# Patient Record
Sex: Female | Born: 2005 | Race: Black or African American | Hispanic: No | Marital: Single | Smoking: Never smoker
Health system: Southern US, Community
[De-identification: ages and names within clinical notes are randomized; demographics above are authoritative.]

## PROBLEM LIST (undated history)

## (undated) DIAGNOSIS — E039 Hypothyroidism, unspecified: Secondary | ICD-10-CM

## (undated) DIAGNOSIS — J45909 Unspecified asthma, uncomplicated: Secondary | ICD-10-CM

## (undated) DIAGNOSIS — J302 Other seasonal allergic rhinitis: Secondary | ICD-10-CM

## (undated) DIAGNOSIS — K760 Fatty (change of) liver, not elsewhere classified: Secondary | ICD-10-CM

## (undated) HISTORY — DX: Other seasonal allergic rhinitis: J30.2

## (undated) HISTORY — PX: WISDOM TOOTH EXTRACTION: SHX21

---
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
## 2024-01-05 NOTE — Procedures (Signed)
 Orders only

## 2024-01-05 NOTE — Procedures (Signed)
 Per appt notes: needs repeat due to insufficient data.

## 2024-01-06 ENCOUNTER — Encounter: Payer: Self-pay | Admitting: Gastroenterology

## 2024-01-06 ENCOUNTER — Encounter
Admission: RE | Disposition: A | Discharge status: Home / Self Care | Payer: Self-pay | Source: Home / Self Care | Attending: Gastroenterology

## 2024-01-06 ENCOUNTER — Ambulatory Visit
Admission: RE | Admit: 2024-01-06 | Discharge: 2024-01-06 | Disposition: A | Discharge status: Home / Self Care | Payer: MEDICAID | Attending: Gastroenterology | Admitting: Gastroenterology

## 2024-01-06 DIAGNOSIS — K581 Irritable bowel syndrome with constipation: Secondary | ICD-10-CM | POA: Diagnosis present

## 2024-01-06 DIAGNOSIS — D508 Other iron deficiency anemias: Secondary | ICD-10-CM | POA: Diagnosis present

## 2024-01-06 DIAGNOSIS — Z539 Procedure and treatment not carried out, unspecified reason: Secondary | ICD-10-CM | POA: Insufficient documentation

## 2024-01-06 HISTORY — PX: COLONOSCOPY: SHX5424

## 2024-01-06 SURGERY — COLONOSCOPY
Anesthesia: General

## 2024-01-06 MED ORDER — SODIUM CHLORIDE 0.9 % IV SOLN: INTRAVENOUS | Status: DC

## 2024-01-06 NOTE — H&P (Signed)
 Ruel Kung , MD [address], [place], [place], [address] Phone: [phone] Fax: [phone]  Primary Care Physician:  [facility]   Pre-Procedure History & Physical: HPI:  Tamara Sanders is a 18 y.o. female is here for an colonoscopy.   Past Medical History:  Diagnosis Date   Asthma    Hypothyroidism    NAFLD (nonalcoholic fatty liver disease)    Seasonal allergies     Past Surgical History:  Procedure Laterality Date   COLONOSCOPY N/A 09/01/2023   Procedure: COLONOSCOPY;  Surgeon: Kung Ruel, MD;  Location: [facility] ENDOSCOPY;  Service: Gastroenterology;  Laterality: N/A;   COLONOSCOPY N/A 09/30/2023   Procedure: COLONOSCOPY;  Surgeon: Kung Ruel, MD;  Location: [facility] ENDOSCOPY;  Service: Gastroenterology;  Laterality: N/A;   ESOPHAGOGASTRODUODENOSCOPY N/A 09/01/2023   Procedure: EGD (ESOPHAGOGASTRODUODENOSCOPY);  Surgeon: Kung Ruel, MD;  Location: [facility] ENDOSCOPY;  Service: Gastroenterology;  Laterality: N/A;   ESOPHAGOGASTRODUODENOSCOPY N/A 09/30/2023   Procedure: EGD (ESOPHAGOGASTRODUODENOSCOPY);  Surgeon: Kung Ruel, MD;  Location: [facility] ENDOSCOPY;  Service: Gastroenterology;  Laterality: N/A;   WISDOM TOOTH EXTRACTION      Prior to Admission medications   Medication Sig Start Date End Date Taking? Authorizing Provider  fexofenadine  (ALLEGRA ) 180 MG tablet Take 1 tablet (180 mg total) by mouth daily. 09/15/22   Orlean Alan HERO, FNP  fluticasone (FLONASE) 50 MCG/ACT nasal spray Place 2 sprays into both nostrils daily. 07/02/22   [provider]  levothyroxine  (SYNTHROID ) 50 MCG tablet Take 1 tablet (50 mcg total) by mouth daily. 05/26/23 05/25/24  Bradler, Evan K, MD  loratadine  (CLARITIN ) 10 MG tablet TAKE 1 TABLET BY MOUTH EVERY DAY 10/09/23   Orlean Alan HERO, FNP  omeprazole  (PRILOSEC) 20 MG capsule Take 1 capsule (20 mg  total) by mouth daily. 08/10/23 02/06/24  [place], PA-C  pantoprazole  (PROTONIX ) 40 MG tablet Take 1 tablet (40 mg total) by mouth daily. 11/21/23 11/20/24  Dorothyann Drivers, MD  polyethylene glycol powder (GLYCOLAX /MIRALAX ) 17 GM/SCOOP powder Take 17 g by mouth daily. 08/10/23 08/04/24  [place], PA-C  sucralfate  (CARAFATE ) 1 g tablet Take 1 tablet (1 g total) by mouth 4 (four) times daily for 15 days. 11/21/23 12/06/23  Dorothyann Drivers, MD  VENTOLIN  HFA 108 (90 Base) MCG/ACT inhaler INHALE 1 TO 2 PUFFS INTO THE LUNGS EVERY 4 HOURS AS NEEDED FOR WHEEZING OR SHORTNESS OF BREATH 11/11/23   Orlean Alan HERO, FNP    Allergies as of 12/09/2023   (No Known Allergies)    No family history on file.  Social History   Socioeconomic History   Marital status: Single    Spouse name: Not on file   Number of children: Not on file   Years of education: Not on file   Highest education level: Not on file  Occupational History   Not on file  Tobacco Use   Smoking status: Never   Smokeless tobacco: Never  Vaping Use   Vaping status: Never Used  Substance and Sexual Activity   Alcohol use: Never   Drug use: Never   Sexual activity: Not on file  Other Topics Concern   Not on file  Social History Narrative   Not on file   Social Drivers of Health   Financial Resource Strain: Low Risk  (12/14/2023)   Received from [facility]   Overall Financial Resource Strain (CARDIA)    How hard is it for you to pay for the very basics like food, housing, medical  care, and heating?: Not hard at all  Food Insecurity: No Food Insecurity (12/14/2023)   Received from [facility]   Hunger Vital Sign    Within the past 12 months, you worried that your food would run out before you got the money to buy more.: Never true    Within the past 12 months, the food you bought just didn't last and you didn't have money to get more.: Never true  Transportation Needs: No Transportation Needs (12/14/2023)    Received from [facility]   PRAPARE - Transportation    Lack of Transportation (Medical): No    Lack of Transportation (Non-Medical): No  Physical Activity: Not on file  Stress: Not on file  Social Connections: Unknown (01/05/2023)   Received from [facility]   Social Network    Social Network: Not on file  Intimate Partner Violence: Not At Risk (08/21/2023)   Received from [facility]   Humiliation, Afraid, Rape, and Kick questionnaire    Within the last year, have you been afraid of your partner or ex-partner?: No    Within the last year, have you been humiliated or emotionally abused in other ways by your partner or ex-partner?: No    Within the last year, have you been kicked, hit, slapped, or otherwise physically hurt by your partner or ex-partner?: No    Within the last year, have you been raped or forced to have any kind of sexual activity by your partner or ex-partner?: No    Review of Systems: See HPI, otherwise negative ROS  Physical Exam: LMP 12/09/2023 (Approximate)  General:   Alert,  pleasant and cooperative in NAD Head:  Normocephalic and atraumatic. Neck:  Supple; no masses or thyromegaly. Lungs:  Clear throughout to auscultation, normal respiratory effort.    Heart:  +S1, +S2, Regular rate and rhythm, No edema. Abdomen:  Soft, nontender and nondistended. Normal bowel sounds, without guarding, and without rebound.   Neurologic:  Alert and  oriented x4;  grossly normal neurologically.  Impression/Plan: Tamara Sanders is here for an colonoscopy to be performed for constipation Risks, benefits, limitations, and alternatives regarding  colonoscopy have been reviewed with the patient.  Questions have been answered.  All parties agreeable.   Ruel Kung, MD  01/06/2024, 9:52 AM

## 2024-01-06 NOTE — Telephone Encounter (Signed)
 Copied from CRM [id number]. Topic: Referral - New Referral Request >> Jan 06, 2024 10:50 AM [name] wrote: The patient is requesting:  Referral: Gastroenterology    Reason for Request: referral for colonoscopy  Facility (Name, Location, & Phone/Fax): Patient/Guardian does not have a preference  Coverage: yes, coverage is accurate on file.  Preferred Contact: Cell Phone Telephone Information: Mobile          [phone]  Routine callback turnaround time: 24-48 business hours. Programmer, systems Notified)

## 2024-01-10 ENCOUNTER — Other Ambulatory Visit: Payer: Self-pay

## 2024-01-10 ENCOUNTER — Encounter: Payer: Self-pay | Admitting: Emergency Medicine

## 2024-01-10 ENCOUNTER — Emergency Department
Admission: EM | Admit: 2024-01-10 | Discharge: 2024-01-11 | Disposition: A | Discharge status: Home / Self Care | Payer: MEDICAID | Attending: Emergency Medicine | Admitting: Emergency Medicine

## 2024-01-10 ENCOUNTER — Emergency Department: Payer: MEDICAID

## 2024-01-10 DIAGNOSIS — K5909 Other constipation: Secondary | ICD-10-CM | POA: Diagnosis not present

## 2024-01-10 DIAGNOSIS — K219 Gastro-esophageal reflux disease without esophagitis: Secondary | ICD-10-CM | POA: Diagnosis not present

## 2024-01-10 DIAGNOSIS — D72829 Elevated white blood cell count, unspecified: Secondary | ICD-10-CM | POA: Diagnosis not present

## 2024-01-10 DIAGNOSIS — R1030 Lower abdominal pain, unspecified: Secondary | ICD-10-CM | POA: Diagnosis present

## 2024-01-10 DIAGNOSIS — R1084 Generalized abdominal pain: Secondary | ICD-10-CM

## 2024-01-10 LAB — COMPREHENSIVE METABOLIC PANEL WITH GFR
ALT: 15 U/L (ref 0–44)
AST: 20 U/L (ref 15–41)
Albumin: 4 g/dL (ref 3.5–5.0)
Alkaline Phosphatase: 68 U/L (ref 38–126)
Anion gap: 9 (ref 5–15)
BUN: 12 mg/dL (ref 6–20)
CO2: 25 mmol/L (ref 22–32)
Calcium: 9.1 mg/dL (ref 8.9–10.3)
Chloride: 105 mmol/L (ref 98–111)
Creatinine, Ser: 0.84 mg/dL (ref 0.44–1.00)
GFR, Estimated: >60 mL/min (ref >60)
Glucose, Bld: 73 mg/dL (ref 70–99)
Potassium: 3.4 mmol/L — ABNORMAL LOW (ref 3.5–5.1)
Sodium: 139 mmol/L (ref 135–145)
Total Bilirubin: 0.9 mg/dL (ref 0.0–1.2)
Total Protein: 7.9 g/dL (ref 6.5–8.1)

## 2024-01-10 LAB — URINALYSIS, ROUTINE W REFLEX MICROSCOPIC
Bacteria, UA: NONE SEEN
Bilirubin Urine: NEGATIVE
Glucose, UA: NEGATIVE mg/dL
Ketones, ur: NEGATIVE mg/dL
Leukocytes,Ua: NEGATIVE
Nitrite: NEGATIVE
Protein, ur: NEGATIVE mg/dL
Specific Gravity, Urine: 1.012 (ref 1.005–1.030)
pH: 6 (ref 5.0–8.0)

## 2024-01-10 LAB — CBC
HCT: 38.2 % (ref 36.0–46.0)
Hemoglobin: 12 g/dL (ref 12.0–15.0)
MCH: 26.7 pg (ref 26.0–34.0)
MCHC: 31.4 g/dL (ref 30.0–36.0)
MCV: 84.9 fL (ref 80.0–100.0)
Platelets: 309 K/uL (ref 150–400)
RBC: 4.5 MIL/uL (ref 3.87–5.11)
RDW: 13.8 % (ref 11.5–15.5)
WBC: 12.3 K/uL — ABNORMAL HIGH (ref 4.0–10.5)
nRBC: 0 % (ref 0.0–0.2)

## 2024-01-10 LAB — LIPASE, BLOOD: Lipase: 39 U/L (ref 11–51)

## 2024-01-10 LAB — POC URINE PREG, ED: Preg Test, Ur: NEGATIVE

## 2024-01-10 MED ORDER — LACTATED RINGERS IV BOLUS
1000.0000 mL | Freq: Once | INTRAVENOUS | Status: AC
  Administered 2024-01-10: 1000 mL via INTRAVENOUS

## 2024-01-10 MED ORDER — IOHEXOL 350 MG/ML SOLN
100.0000 mL | Freq: Once | INTRAVENOUS | Status: AC | PRN
  Administered 2024-01-10: 100 mL via INTRAVENOUS

## 2024-01-10 MED ORDER — ONDANSETRON HCL 4 MG/2ML IJ SOLN
4.0000 mg | INTRAMUSCULAR | Status: AC
  Administered 2024-01-10: 4 mg via INTRAVENOUS
  Filled 2024-01-10: qty 2

## 2024-01-10 MED ORDER — MORPHINE SULFATE (PF) 4 MG/ML IV SOLN
4.0000 mg | Freq: Once | INTRAVENOUS | Status: AC
  Administered 2024-01-10: 4 mg via INTRAVENOUS
  Filled 2024-01-10: qty 1

## 2024-01-10 NOTE — ED Provider Notes (Signed)
 [facility] Provider Note    Event Date/Time   First MD Initiated Contact with Patient 01/10/24 2303     (approximate)   History   Abdominal Pain   HPI Tamara Sanders is a 18 y.o. female who presents for evaluation of pain in her lower abdomen that is gradually gotten worse over the last 2 days.  She said that her menstrual cycle started about a week ago and has been heavier than usual.  However the pain just started about 2 days ago and it is all throughout her abdomen, sometimes going up into her upper abdomen and sometimes remaining in the lower abdomen.  She has a history of constipation for which she was supposed to have a colonoscopy last week but the bowel prep was reportedly insufficient and it was canceled and delayed until this coming week.  She said that she drank all of the liquids and medications that she was supposed to take to prepare for the scope.  She said that it hurts to walk around and to push on her abdomen.  She has had nausea but no vomiting and no diarrhea.     Physical Exam   Triage Vital Signs: ED Triage Vitals  Encounter Vitals Group     BP 01/10/24 2150 117/80     Girls Systolic BP Percentile --      Girls Diastolic BP Percentile --      Boys Systolic BP Percentile --      Boys Diastolic BP Percentile --      Pulse Rate 01/10/24 2150 (!) 121     Resp 01/10/24 2150 18     Temp 01/10/24 2150 (!) 97.4 F (36.3 C)     Temp src --      SpO2 01/10/24 2150 100 %     Weight 01/10/24 2148 113.4 kg (250 lb)     Height 01/10/24 2148 1.6 m (5' 3)     Head Circumference --      Peak Flow --      Pain Score 01/10/24 2148 8     Pain Loc --      Pain Education --      Exclude from Growth Chart --     Most recent vital signs: Vitals:   01/10/24 2150  BP: 117/80  Pulse: (!) 121  Resp: 18  Temp: (!) 97.4 F (36.3 C)  SpO2: 100%    General: Awake, no distress.  CV:  Good peripheral perfusion.  Resp:  Normal effort.  Speaking easily and comfortably, no accessory muscle usage nor intercostal retractions.   Abd:  Obese.  Soft but with tenderness to palpation all throughout the lower abdomen with no specific localized peritonitis but tenderness and guarding throughout.  No tenderness of the epigastrium or right upper quadrant with negative Murphy sign.   ED Results / Procedures / Treatments   Labs (all labs ordered are listed, but only abnormal results are displayed) Labs Reviewed  COMPREHENSIVE METABOLIC PANEL WITH GFR - Abnormal; Notable for the following components:      Result Value   Potassium 3.4 (*)    All other components within normal limits  CBC - Abnormal; Notable for the following components:   WBC 12.3 (*)    All other components within normal limits  URINALYSIS, ROUTINE W REFLEX MICROSCOPIC - Abnormal; Notable for the following components:   Color, Urine STRAW (*)    APPearance CLEAR (*)    Hgb urine dipstick LARGE (*)  All other components within normal limits  LIPASE, BLOOD  POC URINE PREG, ED     RADIOLOGY See ED course for details   PROCEDURES:  Critical Care performed: No  Procedures    IMPRESSION / MDM / ASSESSMENT AND PLAN / ED COURSE  I reviewed the triage vital signs and the nursing notes.                              Differential diagnosis includes, but is not limited to, ovarian cyst plus or minus bleeding or rupture, appendicitis, diverticulitis, biliary colic, less likely STD/PID  Patient's presentation is most consistent with acute presentation with potential threat to life or bodily function.  Labs/studies ordered: CMP, CBC, urinalysis, lipase, urine pregnancy test, CT of the abdomen pelvis  Interventions/Medications given:  Medications  morphine  (PF) 4 MG/ML injection 4 mg (4 mg Intravenous Given 01/10/24 2346)  ondansetron  (ZOFRAN ) injection 4 mg (4 mg Intravenous Given 01/10/24 2345)  lactated ringers  bolus 1,000 mL (1,000 mLs Intravenous New  Bag/Given 01/10/24 2350)  iohexol  (OMNIPAQUE ) 350 MG/ML injection 100 mL (100 mLs Intravenous Contrast Given 01/10/24 2355)  dicyclomine  (BENTYL ) capsule 20 mg (20 mg Oral Given 01/11/24 0121)  pantoprazole  (PROTONIX ) injection 40 mg (40 mg Intravenous Given 01/11/24 0120)  ketorolac  (TORADOL ) 30 MG/ML injection 15 mg (15 mg Intravenous Given 01/11/24 0120)    (Note:  hospital course my include additional interventions and/or labs/studies not listed above.)   Patient is generally well-appearing but she is tachycardic and having generalized abdominal pain.  Lab work is all essentially normal other than a mild nonspecific leukocytosis.  She is quite tender to palpation in the abdomen.  I have ordered morphine  4 mg IV, Zofran  4 mg IV, and 1 L of LR.  CT scan pending, I considered ultrasound but given the generalized tenderness I think the CT would be more beneficial to rule out acute and emergent abnormality such as active extravasation of ovarian cyst.     Clinical Course as of 01/11/24 0154  Mon Jan 11, 2024  0035 CT ABDOMEN PELVIS W CONTRAST I independently viewed and interpreted the patient's abd/pelvis CT, as well as reviewing the radiologist's report.  The patient has no obvious obstruction, excessive stool burden, inflammation in the bowels, or large ovarian cysts.  I do not appreciate any free fluid in the abdomen.  The radiologist confirmed no acute findings. [CF]  0109 Reassessed patient.  She is reporting persistent discomfort in her abdomen and said that it comes and goes in waves and is rolling.  She said that she feels it up in her esophagus.  She takes pantoprazole  once daily.  I explained to her and her mother, who is at bedside, that her workup has been very reassuring with no evidence of an emergent condition at this time.  The CT scan was normal and we went over all the other results.  It sounds as if she is suffering from both pain associate with constipation as well as persistent  acid reflux.  The patient mention that she has had an endoscopy before and they told her that her esophagus looked normal but I still feel like I have acid reflux.  I explained to her that you can have acid reflux without seeing it on the endoscopy and recommended she talk with her gastroenterologist about additional or alternative medication therapy to help with the reflux.  Her heart rate has come down to read around  100.  I will provide additional nonopioid medication because opioids will likely only make her more constipated.  I ordered Toradol  15 mg IV, Bentyl  20 mg p.o., and pantoprazole  40 mg.  I explained to the patient and her mother that given no evidence of an emergent/acute issue, the plan is for discharge and close outpatient follow-up.  They understand the plan.  I had a long talk about how it is unlikely I would be able to turn off the symptoms, the goal is to manage them so that she can follow-up as an outpatient [CF]    Clinical Course User Index [CF] Gordan Huxley, MD     FINAL CLINICAL IMPRESSION(S) / ED DIAGNOSES   Final diagnoses:  Generalized abdominal pain  Gastroesophageal reflux disease, unspecified whether esophagitis present  Chronic constipation     Rx / DC Orders   ED Discharge Orders          Ordered    dicyclomine  (BENTYL ) 10 MG capsule  3 times daily PRN        01/11/24 0112    ondansetron  (ZOFRAN -ODT) 4 MG disintegrating tablet        01/11/24 0112             Note:  This document was prepared using Dragon voice recognition software and may include unintentional dictation errors.   Gordan Huxley, MD 01/11/24 [phone]

## 2024-01-10 NOTE — ED Triage Notes (Addendum)
 Pt c/o lower abdominal pain x 2 days. Denies urinary symptoms. States that her menstrual cycle that started on 8/3 has been heavier with occasional blood clots.

## 2024-01-11 MED ORDER — DICYCLOMINE HCL 10 MG PO CAPS: 10.0000 mg | ORAL_CAPSULE | Freq: Three times a day (TID) | ORAL | 0 refills | Status: AC | PRN

## 2024-01-11 MED ORDER — KETOROLAC TROMETHAMINE 30 MG/ML IJ SOLN
15.0000 mg | Freq: Once | INTRAMUSCULAR | Status: AC
  Administered 2024-01-11 (×2): 15 mg via INTRAVENOUS
  Filled 2024-01-11: qty 1

## 2024-01-11 MED ORDER — ONDANSETRON 4 MG PO TBDP: ORAL_TABLET | ORAL | 0 refills | Status: AC

## 2024-01-11 MED ORDER — PANTOPRAZOLE SODIUM 40 MG IV SOLR
40.0000 mg | Freq: Once | INTRAVENOUS | Status: AC
  Administered 2024-01-11 (×2): 40 mg via INTRAVENOUS
  Filled 2024-01-11: qty 10

## 2024-01-11 MED ORDER — DICYCLOMINE HCL 10 MG PO CAPS
20.0000 mg | ORAL_CAPSULE | Freq: Once | ORAL | Status: AC
  Administered 2024-01-11 (×2): 20 mg via ORAL
  Filled 2024-01-11: qty 2

## 2024-01-11 NOTE — Discharge Instructions (Addendum)
 You have been seen in the Emergency Department (ED) for abdominal pain.  Your evaluation did not identify a clear cause of your symptoms but was generally reassuring.  Please take any medications you have been previously prescribed as well as any new medications prescribed tonight according to the instructions on the label.  Please read through the information about chronic constipation and consider an aggressive bowel regimen including over-the-counter remedies such as Colace (a stool softener), senna (a bowel stimulant), and MiraLAX  (an osmotic laxative that will help you move the stool through your body).  It is important to drink plenty of fluids to make sure there is enough fluid in your bowels to help you have bowel movements.  Please follow up as instructed above regarding today's emergent visit and the symptoms that are bothering you.  Return to the ED if your abdominal pain worsens or fails to improve, you develop bloody vomiting, bloody diarrhea, you are unable to tolerate fluids due to vomiting, fever greater than 101, or other symptoms that concern you.

## 2024-01-11 NOTE — ED Notes (Signed)
 Pt given DC instructions. Pt verbalized understanding of medications and follow up care. Pt ambulatory from ED without difficulty.

## 2024-01-11 NOTE — ED Notes (Signed)
 Pt medicated per MAR. Pt taken to CT at this time.

## 2024-01-11 NOTE — ED Notes (Signed)
 Pt returned from CT

## 2024-01-11 NOTE — ED Notes (Signed)
 Introduced self to pt and mother. Pt has call bell in reach. No further needs at this time.

## 2024-01-13 ENCOUNTER — Encounter: Payer: Self-pay | Admitting: Gastroenterology

## 2024-01-13 ENCOUNTER — Ambulatory Visit
Admission: RE | Admit: 2024-01-13 | Discharge: 2024-01-13 | Disposition: A | Discharge status: Home / Self Care | Payer: MEDICAID | Attending: Gastroenterology | Admitting: Gastroenterology

## 2024-01-13 ENCOUNTER — Ambulatory Visit: Payer: MEDICAID | Admitting: Anesthesiology

## 2024-01-13 ENCOUNTER — Encounter
Admission: RE | Disposition: A | Discharge status: Home / Self Care | Payer: Self-pay | Source: Home / Self Care | Attending: Gastroenterology

## 2024-01-13 ENCOUNTER — Other Ambulatory Visit: Payer: Self-pay

## 2024-01-13 DIAGNOSIS — K581 Irritable bowel syndrome with constipation: Secondary | ICD-10-CM | POA: Diagnosis not present

## 2024-01-13 DIAGNOSIS — E039 Hypothyroidism, unspecified: Secondary | ICD-10-CM | POA: Diagnosis not present

## 2024-01-13 DIAGNOSIS — E66813 Obesity, class 3: Secondary | ICD-10-CM | POA: Insufficient documentation

## 2024-01-13 DIAGNOSIS — Z68.41 Body mass index (BMI) pediatric, greater than or equal to 95th percentile for age: Secondary | ICD-10-CM | POA: Diagnosis not present

## 2024-01-13 DIAGNOSIS — D509 Iron deficiency anemia, unspecified: Secondary | ICD-10-CM | POA: Insufficient documentation

## 2024-01-13 HISTORY — PX: COLONOSCOPY: SHX5424

## 2024-01-13 LAB — POCT PREGNANCY, URINE: Preg Test, Ur: NEGATIVE

## 2024-01-13 SURGERY — COLONOSCOPY
Anesthesia: General

## 2024-01-13 MED ORDER — FENTANYL CITRATE (PF) 100 MCG/2ML IJ SOLN
INTRAMUSCULAR | Status: DC | PRN
  Administered 2024-01-13 (×2): 100 ug via INTRAVENOUS

## 2024-01-13 MED ORDER — DEXMEDETOMIDINE HCL IN NACL 200 MCG/50ML IV SOLN
INTRAVENOUS | Status: DC | PRN
  Administered 2024-01-13 (×2): 16 ug via INTRAVENOUS

## 2024-01-13 MED ORDER — PHENYLEPHRINE 80 MCG/ML (10ML) SYRINGE FOR IV PUSH (FOR BLOOD PRESSURE SUPPORT)
PREFILLED_SYRINGE | INTRAVENOUS | Status: DC | PRN
  Administered 2024-01-13 (×4): 120 ug via INTRAVENOUS

## 2024-01-13 MED ORDER — SODIUM CHLORIDE 0.9 % IV SOLN: INTRAVENOUS | Status: DC

## 2024-01-13 MED ORDER — PROPOFOL 500 MG/50ML IV EMUL
INTRAVENOUS | Status: DC | PRN
  Administered 2024-01-13 (×2): 150 ug/kg/min via INTRAVENOUS

## 2024-01-13 MED ORDER — MIDAZOLAM HCL 2 MG/2ML IJ SOLN
INTRAMUSCULAR | Status: AC
  Filled 2024-01-13: qty 2

## 2024-01-13 MED ORDER — FENTANYL CITRATE (PF) 100 MCG/2ML IJ SOLN
INTRAMUSCULAR | Status: AC
  Filled 2024-01-13: qty 2

## 2024-01-13 MED ORDER — MIDAZOLAM HCL 2 MG/2ML IJ SOLN
INTRAMUSCULAR | Status: DC | PRN
  Administered 2024-01-13 (×2): 2 mg via INTRAVENOUS

## 2024-01-13 MED ORDER — PROPOFOL 10 MG/ML IV BOLUS
INTRAVENOUS | Status: DC | PRN
  Administered 2024-01-13 (×2): 100 mg via INTRAVENOUS

## 2024-01-13 NOTE — Op Note (Signed)
 [facility] Gastroenterology Patient Name: Tamara Sanders Procedure Date: 01/13/2024 8:19 AM MRN: [id number] Account #: [id number] Date of Birth: 2006-04-08 Admit Type: Outpatient Age: 18 Room: [facility] ENDO ROOM 3 Gender: Female Note Status: Finalized Instrument Name: Colon Scope [id number] Procedure:             Colonoscopy Indications:           Iron deficiency anemia Providers:             Ruel Kung MD, MD Referring MD:          [facility] (Referring MD) Medicines:             Monitored Anesthesia Care Complications:         No immediate complications. Procedure:             Pre-Anesthesia Assessment:                        - Prior to the procedure, a History and Physical was                         performed, and patient medications, allergies and                         sensitivities were reviewed. The patient's tolerance                         of previous anesthesia was reviewed.                        - The risks and benefits of the procedure and the                         sedation options and risks were discussed with the                         patient. All questions were answered and informed                         consent was obtained.                        - ASA Grade Assessment: II - A patient with mild                         systemic disease.                        After obtaining informed consent, the colonoscope was                         passed under direct vision. Throughout the procedure,                         the patient's blood pressure, pulse, and oxygen                         saturations were monitored continuously. The was                         introduced through the anus  The was                         introduced through the anus and advanced to the the                         terminal ileum. The colonoscopy was performed with                         ease. The patient tolerated the procedure well. The                          quality of the bowel preparation was excellent. The                         terminal ileum, ileocecal valve, appendiceal orifice,                         and rectum were photographed. Findings:      The terminal ileum appeared normal.      The entire examined colon appeared normal on direct and retroflexion       views. Impression:            - The examined portion of the ileum was normal.                        - The entire examined colon is normal on direct and                         retroflexion views.                        - No specimens collected. Recommendation:        - Discharge patient to home (with escort).                        - Resume previous diet.                        - Continue present medications.                        - Return to GI office as previously scheduled. Procedure Code(s):     --- Professional ---                        [phone], Colonoscopy, flexible; diagnostic, including                         collection of specimen(s) by brushing or washing, when                         performed (separate procedure) Diagnosis Code(s):     --- Professional ---                        D50.9, Iron deficiency anemia, unspecified CPT copyright 2022 American Medical Association. All rights reserved. The codes documented in this report are preliminary and upon coder review may  be revised to meet current compliance requirements. Ruel Kung, MD Ruel Kung MD, MD 01/13/2024  Duration: 0 hours 8 minutes 42 seconds  Estimated Blood Loss:  Estimated blood loss: none.      [facility]

## 2024-01-13 NOTE — H&P (Signed)
 Ruel Kung , MD [address], [place], [place], [address] Phone: [phone] Fax: [phone]  Primary Care Physician:  [facility]   Pre-Procedure History & Physical: HPI:  Tamara Sanders is a 18 y.o. female is here for an colonoscopy.   Past Medical History:  Diagnosis Date   Asthma    Hypothyroidism    NAFLD (nonalcoholic fatty liver disease)    Seasonal allergies     Past Surgical History:  Procedure Laterality Date   COLONOSCOPY N/A 09/01/2023   Procedure: COLONOSCOPY;  Surgeon: Kung Ruel, MD;  Location: [facility] ENDOSCOPY;  Service: Gastroenterology;  Laterality: N/A;   COLONOSCOPY N/A 09/30/2023   Procedure: COLONOSCOPY;  Surgeon: Kung Ruel, MD;  Location: [facility] ENDOSCOPY;  Service: Gastroenterology;  Laterality: N/A;   COLONOSCOPY N/A 01/06/2024   Procedure: COLONOSCOPY;  Surgeon: Kung Ruel, MD;  Location: [facility] ENDOSCOPY;  Service: Gastroenterology;  Laterality: N/A;   ESOPHAGOGASTRODUODENOSCOPY N/A 09/01/2023   Procedure: EGD (ESOPHAGOGASTRODUODENOSCOPY);  Surgeon: Kung Ruel, MD;  Location: [facility] ENDOSCOPY;  Service: Gastroenterology;  Laterality: N/A;   ESOPHAGOGASTRODUODENOSCOPY N/A 09/30/2023   Procedure: EGD (ESOPHAGOGASTRODUODENOSCOPY);  Surgeon: Kung Ruel, MD;  Location: [facility] ENDOSCOPY;  Service: Gastroenterology;  Laterality: N/A;   WISDOM TOOTH EXTRACTION      Prior to Admission medications   Medication Sig Start Date End Date Taking? Authorizing Provider  dicyclomine  (BENTYL ) 10 MG capsule Take 1 capsule (10 mg total) by mouth 3 (three) times daily as needed for spasms (abdominal pain). 01/11/24  Yes Gordan Huxley, MD  fexofenadine  (ALLEGRA ) 180 MG tablet Take 1 tablet (180 mg total) by mouth daily. 09/15/22  Yes Orlean Alan HERO, FNP  fluticasone (FLONASE) 50 MCG/ACT nasal spray Place 2 sprays into both nostrils daily.  07/02/22  Yes [provider]  levothyroxine  (SYNTHROID ) 50 MCG tablet Take 1 tablet (50 mcg total) by mouth daily. 05/26/23 05/25/24 Yes Bradler, Evan K, MD  loratadine  (CLARITIN ) 10 MG tablet TAKE 1 TABLET BY MOUTH EVERY DAY 10/09/23  Yes Orlean Alan HERO, FNP  omeprazole  (PRILOSEC) 20 MG capsule Take 1 capsule (20 mg total) by mouth daily. 08/10/23 02/06/24 Yes [place], PA-C  pantoprazole  (PROTONIX ) 40 MG tablet Take 1 tablet (40 mg total) by mouth daily. 11/21/23 11/20/24 Yes Dorothyann Drivers, MD  sucralfate  (CARAFATE ) 1 g tablet Take 1 tablet (1 g total) by mouth 4 (four) times daily for 15 days. 11/21/23 01/13/24 Yes Dorothyann Drivers, MD  ondansetron  (ZOFRAN -ODT) 4 MG disintegrating tablet Allow 1-2 tablets to dissolve in your mouth every 8 hours as needed for nausea/vomiting 01/11/24   Gordan Huxley, MD  polyethylene glycol powder (GLYCOLAX /MIRALAX ) 17 GM/SCOOP powder Take 17 g by mouth daily. 08/10/23 08/04/24  [place], PA-C  VENTOLIN  HFA 108 (90 Base) MCG/ACT inhaler INHALE 1 TO 2 PUFFS INTO THE LUNGS EVERY 4 HOURS AS NEEDED FOR WHEEZING OR SHORTNESS OF BREATH 11/11/23   Orlean Alan HERO, FNP    Allergies as of 01/11/2024   (No Known Allergies)    History reviewed. No pertinent family history.  Social History   Socioeconomic History   Marital status: Single    Spouse name: Not on file   Number of children: Not on file   Years of education: Not on file   Highest education level: Not on file  Occupational History   Not on file  Tobacco Use   Smoking status: Never   Smokeless tobacco: Never  Vaping Use   Vaping status: Never Used  Substance and Sexual Activity  Alcohol use: Never   Drug use: Never   Sexual activity: Not on file  Other Topics Concern   Not on file  Social History Narrative   Not on file   Social Drivers of Health   Financial Resource Strain: Low Risk  (12/14/2023)   Received from [facility]   Overall Financial Resource Strain  (CARDIA)    How hard is it for you to pay for the very basics like food, housing, medical care, and heating?: Not hard at all  Food Insecurity: No Food Insecurity (12/14/2023)   Received from [facility]   Hunger Vital Sign    Within the past 12 months, you worried that your food would run out before you got the money to buy more.: Never true    Within the past 12 months, the food you bought just didn't last and you didn't have money to get more.: Never true  Transportation Needs: No Transportation Needs (12/14/2023)   Received from [facility]   PRAPARE - Transportation    Lack of Transportation (Medical): No    Lack of Transportation (Non-Medical): No  Physical Activity: Not on file  Stress: Not on file  Social Connections: Unknown (01/05/2023)   Received from [facility]   Social Network    Social Network: Not on file  Intimate Partner Violence: Not At Risk (08/21/2023)   [facility]   Humiliation, Afraid, Rape, and Kick questionnaire    Within the last year, have you been afraid of your partner or ex-partner?: No    Within the last year, have you been humiliated or emotionally abused in other ways by your partner or ex-partner?: No    Within the last year, have you been kicked, hit, slapped, or otherwise physically hurt by your partner or ex-partner?: No    Within the last year, have you been raped or forced to have any kind of sexual activity by your partner or ex-partner?: No    Review of Systems: See HPI, otherwise negative ROS  Physical Exam: BP 111/78   Pulse (!) 101   Temp (!) 97.1 F (36.2 C)   Wt 113 kg   LMP 01/03/2024 (Approximate)   SpO2 100%   BMI 44.13 kg/m  General:   Alert,  pleasant and cooperative in NAD Head:  Normocephalic and atraumatic. Neck:  Supple; no masses or thyromegaly. Lungs:  Clear throughout to auscultation, normal respiratory effort.    Heart:  +S1, +S2, Regular rate and rhythm, No edema. Abdomen:  Soft, nontender  and nondistended. Normal bowel sounds, without guarding, and without rebound.   Neurologic:  Alert and  oriented x4;  grossly normal neurologically.  Impression/Plan: Tamara Sanders is here for an colonoscopy to be performed for anemia  Risks, benefits, limitations, and alternatives regarding  colonoscopy have been reviewed with the patient.  Questions have been answered.  All parties agreeable.   Ruel Kung, MD  01/13/2024, 7:54 AM

## 2024-01-13 NOTE — Anesthesia Postprocedure Evaluation (Signed)
 Anesthesia Post Note  Patient: TALLIA MOEHRING  Procedure(s) Performed: COLONOSCOPY  Patient location during evaluation: Endoscopy Anesthesia Type: General Level of consciousness: awake and alert Pain management: pain level controlled Vital Signs Assessment: post-procedure vital signs reviewed and stable Respiratory status: spontaneous breathing, nonlabored ventilation, respiratory function stable and patient connected to nasal cannula oxygen Cardiovascular status: blood pressure returned to baseline and stable Postop Assessment: no apparent nausea or vomiting Anesthetic complications: no   No notable events documented.   Last Vitals:  Vitals:   01/13/24 0858 01/13/24 0908  BP: 111/61 101/80  Pulse:    Temp:    SpO2:      Last Pain:  Vitals:   01/13/24 0908  PainSc: 0-No pain                 Lendia LITTIE Mae

## 2024-01-13 NOTE — Discharge Instructions (Signed)
 Mother did not come in as expected, instructions given to pt. Fully awake and verbalizes understanding

## 2024-01-13 NOTE — Anesthesia Postprocedure Evaluation (Signed)
 Anesthesia Post Note  Patient: Tamara Sanders  Procedure(s) Performed: COLONOSCOPY  Patient location during evaluation: Endoscopy Anesthesia Type: General Level of consciousness: awake and alert Pain management: pain level controlled Vital Signs Assessment: post-procedure vital signs reviewed and stable Respiratory status: spontaneous breathing, nonlabored ventilation, respiratory function stable and patient connected to nasal cannula oxygen Cardiovascular status: blood pressure returned to baseline and stable Postop Assessment: no apparent nausea or vomiting Anesthetic complications: no   No notable events documented.   Last Vitals:  Vitals:   01/13/24 0858 01/13/24 0908  BP: 111/61 101/80  Pulse:    Temp:    SpO2:      Last Pain:  Vitals:   01/13/24 0908  PainSc: 0-No pain                 Lendia LITTIE Mae

## 2024-01-13 NOTE — Transfer of Care (Signed)
 Immediate Anesthesia Transfer of Care Note  Patient: Tamara Sanders  Procedure(s) Performed: COLONOSCOPY  Patient Location: PACU  Anesthesia Type:General  Level of Consciousness: drowsy  Airway & Oxygen Therapy: Patient Spontanous Breathing and Patient connected to face mask oxygen  Post-op Assessment: Report given to RN and Post -op Vital signs reviewed and stable  Post vital signs: Reviewed and stable  Last Vitals:  Vitals Value Taken Time  BP 89/51 01/13/24 08:49  Temp    Pulse 81 01/13/24 08:49  Resp 24 01/13/24 08:49  SpO2 99 % 01/13/24 08:49  Vitals shown include unfiled device data.  Last Pain:  Vitals:   01/13/24 0744  PainSc: 0-No pain         Complications: No notable events documented.

## 2024-01-13 NOTE — Anesthesia Preprocedure Evaluation (Addendum)
 Anesthesia Evaluation  Patient identified by MRN, date of birth, ID band Patient awake    Reviewed: Allergy & Precautions, NPO status , Patient's Chart, lab work & pertinent test results  History of Anesthesia Complications Negative for: history of anesthetic complications  Airway Mallampati: III  TM Distance: >3 FB Neck ROM: full    Dental no notable dental hx.    Pulmonary neg pulmonary ROS   Pulmonary exam normal        Cardiovascular negative cardio ROS Normal cardiovascular exam     Neuro/Psych negative neurological ROS  negative psych ROS   GI/Hepatic negative GI ROS, Neg liver ROS,,,  Endo/Other  Hypothyroidism  Class 3 obesity  Renal/GU negative Renal ROS  negative genitourinary   Musculoskeletal   Abdominal   Peds  Hematology negative hematology ROS (+)   Anesthesia Other Findings Past Medical History: No date: Asthma No date: Hypothyroidism No date: Seasonal allergies  Past Surgical History: 09/01/2023: COLONOSCOPY; N/A     Comment:  Procedure: COLONOSCOPY;  Surgeon: Therisa Bi, MD;                Location: [facility] ENDOSCOPY;  Service: Gastroenterology;                Laterality: N/A; 09/01/2023: ESOPHAGOGASTRODUODENOSCOPY; N/A     Comment:  Procedure: EGD (ESOPHAGOGASTRODUODENOSCOPY);  Surgeon:               Therisa Bi, MD;  Location: [facility] ENDOSCOPY;  Service:               Gastroenterology;  Laterality: N/A; No date: WISDOM TOOTH EXTRACTION  BMI    Body Mass Index: 45.63 kg/m      Reproductive/Obstetrics negative OB ROS                              Anesthesia Physical Anesthesia Plan  ASA: 3  Anesthesia Plan: General   Post-op Pain Management: Minimal or no pain anticipated   Induction: Intravenous  PONV Risk Score and Plan: 3 and Propofol  infusion, TIVA and Ondansetron   Airway Management Planned: Nasal Cannula  Additional Equipment: None  Intra-op  Plan:   Post-operative Plan:   Informed Consent: I have reviewed the patients History and Physical, chart, labs and discussed the procedure including the risks, benefits and alternatives for the proposed anesthesia with the patient or authorized representative who has indicated his/her understanding and acceptance.     Dental advisory given  Plan Discussed with: CRNA and Surgeon  Anesthesia Plan Comments: (Discussed risks of anesthesia with patient, including possibility of difficulty with spontaneous ventilation under anesthesia necessitating airway intervention, PONV, and rare risks such as cardiac or respiratory or neurological events, and allergic reactions. Discussed the role of CRNA in patient's perioperative care. Patient understands.)        Anesthesia Quick Evaluation

## 2024-02-10 ENCOUNTER — Encounter: Payer: Self-pay | Admitting: Internal Medicine

## 2024-02-10 ENCOUNTER — Inpatient Hospital Stay: Payer: MEDICAID | Attending: Internal Medicine | Admitting: Internal Medicine

## 2024-02-10 ENCOUNTER — Inpatient Hospital Stay: Payer: MEDICAID

## 2024-02-10 VITALS — BP 115/68 | HR 88 | Temp 98.0°F | Resp 16 | Ht 63.0 in | Wt 249.5 lb

## 2024-02-10 DIAGNOSIS — D509 Iron deficiency anemia, unspecified: Secondary | ICD-10-CM | POA: Diagnosis present

## 2024-02-10 DIAGNOSIS — Z79899 Other long term (current) drug therapy: Secondary | ICD-10-CM | POA: Diagnosis not present

## 2024-02-10 DIAGNOSIS — N921 Excessive and frequent menstruation with irregular cycle: Secondary | ICD-10-CM | POA: Diagnosis not present

## 2024-02-10 DIAGNOSIS — Z79890 Hormone replacement therapy (postmenopausal): Secondary | ICD-10-CM | POA: Diagnosis not present

## 2024-02-10 DIAGNOSIS — J45909 Unspecified asthma, uncomplicated: Secondary | ICD-10-CM | POA: Diagnosis not present

## 2024-02-10 DIAGNOSIS — N924 Excessive bleeding in the premenopausal period: Secondary | ICD-10-CM

## 2024-02-10 DIAGNOSIS — Z808 Family history of malignant neoplasm of other organs or systems: Secondary | ICD-10-CM | POA: Diagnosis not present

## 2024-02-10 DIAGNOSIS — Z8051 Family history of malignant neoplasm of kidney: Secondary | ICD-10-CM | POA: Diagnosis not present

## 2024-02-10 DIAGNOSIS — E039 Hypothyroidism, unspecified: Secondary | ICD-10-CM | POA: Insufficient documentation

## 2024-02-10 LAB — CBC WITH DIFFERENTIAL/PLATELET
Abs Immature Granulocytes: 0.01 K/uL (ref 0.00–0.07)
Basophils Absolute: 0 K/uL (ref 0.0–0.1)
Basophils Relative: 0 %
Eosinophils Absolute: 0.1 K/uL (ref 0.0–0.5)
Eosinophils Relative: 2 %
HCT: 37.4 % (ref 36.0–46.0)
Hemoglobin: 11.8 g/dL — ABNORMAL LOW (ref 12.0–15.0)
Immature Granulocytes: 0 %
Lymphocytes Relative: 26 %
Lymphs Abs: 1.3 K/uL (ref 0.7–4.0)
MCH: 27.2 pg (ref 26.0–34.0)
MCHC: 31.6 g/dL (ref 30.0–36.0)
MCV: 86.2 fL (ref 80.0–100.0)
Monocytes Absolute: 0.3 K/uL (ref 0.1–1.0)
Monocytes Relative: 7 %
Neutro Abs: 3.2 K/uL (ref 1.7–7.7)
Neutrophils Relative %: 65 %
Platelets: 326 K/uL (ref 150–400)
RBC: 4.34 MIL/uL (ref 3.87–5.11)
RDW: 14 % (ref 11.5–15.5)
WBC: 5 K/uL (ref 4.0–10.5)
nRBC: 0 % (ref 0.0–0.2)

## 2024-02-10 LAB — APTT: aPTT: 29 s (ref 24–36)

## 2024-02-10 LAB — COMPREHENSIVE METABOLIC PANEL WITH GFR
ALT: 22 U/L (ref 0–44)
AST: 22 U/L (ref 15–41)
Albumin: 3.6 g/dL (ref 3.5–5.0)
Alkaline Phosphatase: 52 U/L (ref 38–126)
Anion gap: 7 (ref 5–15)
BUN: 11 mg/dL (ref 6–20)
CO2: 22 mmol/L (ref 22–32)
Calcium: 9.2 mg/dL (ref 8.9–10.3)
Chloride: 105 mmol/L (ref 98–111)
Creatinine, Ser: 0.83 mg/dL (ref 0.44–1.00)
GFR, Estimated: >60 mL/min (ref >60)
Glucose, Bld: 103 mg/dL — ABNORMAL HIGH (ref 70–99)
Potassium: 4 mmol/L (ref 3.5–5.1)
Sodium: 134 mmol/L — ABNORMAL LOW (ref 135–145)
Total Bilirubin: 0.5 mg/dL (ref 0.0–1.2)
Total Protein: 7.1 g/dL (ref 6.5–8.1)

## 2024-02-10 LAB — IRON AND TIBC
Iron: 99 ug/dL (ref 28–170)
Saturation Ratios: 28 % (ref 10.4–31.8)
TIBC: 356 ug/dL (ref 250–450)
UIBC: 257 ug/dL

## 2024-02-10 LAB — PROTIME-INR
INR: 0.9 (ref 0.8–1.2)
Prothrombin Time: 13.1 s (ref 11.4–15.2)

## 2024-02-10 NOTE — Assessment & Plan Note (Addendum)
#   Symptomatic Iron deficiency anemia: on PO iron [Iron sulfate- on Miralax -]. Check iron studies; ferritin.   # Hx of heavy menstrual cycles/hx suggestive of gum bleeding/Excessive bleeding after tooth extraction-HOWEVER, Elevated VWB antigen levels- re-check PT/PT and vWD work up.    Thank you for allowing me to participate in the care of your pleasant patient. Please do not hesitate to contact me with questions or concerns in the interim. The above of plan of care was discussed with Patient and her mother in detail.   # DISPOSITION: # labs today-  # follow up next Friday- MD; No labs- [if needed- ok with chemo pt slot]- Dr.B

## 2024-02-10 NOTE — Progress Notes (Signed)
No questions at this time

## 2024-02-10 NOTE — Progress Notes (Signed)
 [facility] CONSULT NOTE  Patient Care Team: Care, [facility] as PCP - General Rennie Cindy SAUNDERS, MD as Consulting Physician (Oncology)  CHIEF COMPLAINTS/PURPOSE OF CONSULTATION: bleeding.   Oncology History   No history exists.    HISTORY OF PRESENTING ILLNESS: Patient ambulating- independently.  Accompanied by mother.   Tamara Sanders 18 y.o.  female pleasant patient with a history of heavy menstrual cycles and also elevated von Willebrand levels has been referred to us  for further evaluation.  Patient has a longstanding history of chronic abdominal pain associated with heavy menstrual cycles.  Patient also has history of chronic anemia.  She is currently on Po iron. Patient takes Miralax  to prevent constipation.   Hx of tooth extraction- noted to have excessive bleeding. NO famliy Hx- any bleeding disorders.   Review of Systems  Constitutional:  Negative for chills, diaphoresis, fever, malaise/fatigue and weight loss.  HENT:  Negative for nosebleeds and sore throat.   Eyes:  Negative for double vision.  Respiratory:  Negative for cough, hemoptysis, sputum production, shortness of breath and wheezing.   Cardiovascular:  Negative for chest pain, palpitations, orthopnea and leg swelling.  Gastrointestinal:  Negative for abdominal pain, blood in stool, constipation, diarrhea, heartburn, melena, nausea and vomiting.  Genitourinary:  Negative for dysuria, frequency and urgency.  Musculoskeletal:  Negative for back pain and joint pain.  Skin: Negative.  Negative for itching and rash.  Neurological:  Negative for dizziness, tingling, focal weakness, weakness and headaches.  Endo/Heme/Allergies:  Does not bruise/bleed easily.  Psychiatric/Behavioral:  Negative for depression. The patient is not nervous/anxious and does not have insomnia.     MEDICAL HISTORY:  Past Medical History:  Diagnosis Date   Asthma    Hypothyroidism    NAFLD (nonalcoholic fatty liver  disease)    Seasonal allergies     SURGICAL HISTORY: Past Surgical History:  Procedure Laterality Date   COLONOSCOPY N/A 09/01/2023   Procedure: COLONOSCOPY;  Surgeon: Therisa Bi, MD;  Location: [facility] ENDOSCOPY;  Service: Gastroenterology;  Laterality: N/A;   COLONOSCOPY N/A 09/30/2023   Procedure: COLONOSCOPY;  Surgeon: Therisa Bi, MD;  Location: [facility] ENDOSCOPY;  Service: Gastroenterology;  Laterality: N/A;   COLONOSCOPY N/A 01/06/2024   Procedure: COLONOSCOPY;  Surgeon: Therisa Bi, MD;  Location: [facility] ENDOSCOPY;  Service: Gastroenterology;  Laterality: N/A;   COLONOSCOPY N/A 01/13/2024   Procedure: COLONOSCOPY;  Surgeon: Therisa Bi, MD;  Location: [facility] ENDOSCOPY;  Service: Gastroenterology;  Laterality: N/A;   ESOPHAGOGASTRODUODENOSCOPY N/A 09/01/2023   Procedure: EGD (ESOPHAGOGASTRODUODENOSCOPY);  Surgeon: Therisa Bi, MD;  Location: [facility] ENDOSCOPY;  Service: Gastroenterology;  Laterality: N/A;   ESOPHAGOGASTRODUODENOSCOPY N/A 09/30/2023   Procedure: EGD (ESOPHAGOGASTRODUODENOSCOPY);  Surgeon: Therisa Bi, MD;  Location: [facility] ENDOSCOPY;  Service: Gastroenterology;  Laterality: N/A;   WISDOM TOOTH EXTRACTION      SOCIAL HISTORY: Social History   Socioeconomic History   Marital status: Single    Spouse name: Not on file   Number of children: Not on file   Years of education: Not on file   Highest education level: Not on file  Occupational History   Not on file  Tobacco Use   Smoking status: Never   Smokeless tobacco: Never  Vaping Use   Vaping status: Never Used  Substance and Sexual Activity   Alcohol use: Never   Drug use: Never   Sexual activity: Not Currently  Other Topics Concern   Not on file  Social History Narrative   Not on file   Social  Drivers of Health   Financial Resource Strain: Low Risk  (12/14/2023)   Received from [facility]   Overall Financial Resource Strain (CARDIA)    How hard is it for you to pay for the very basics like food, housing, medical  care, and heating?: Not hard at all  Food Insecurity: No Food Insecurity (02/10/2024)   Hunger Vital Sign    Worried About Running Out of Food in the Last Year: Never true    Ran Out of Food in the Last Year: Never true  Transportation Needs: No Transportation Needs (02/10/2024)   PRAPARE - Administrator, Civil Service (Medical): No    Lack of Transportation (Non-Medical): No  Physical Activity: Not on file  Stress: Not on file  Social Connections: Unknown (01/05/2023)   Received from [facility]   Social Network    Social Network: Not on file  Intimate Partner Violence: Not At Risk (02/10/2024)   Humiliation, Afraid, Rape, and Kick questionnaire    Fear of Current or Ex-Partner: No    Emotionally Abused: No    Physically Abused: No    Sexually Abused: No    FAMILY HISTORY: Family History  Problem Relation Age of Onset   Kidney cancer Mother    Healthy Father    Healthy Brother    Throat cancer Maternal Grandfather    Kidney cancer Maternal Grandfather    Kidney cancer Paternal Grandfather     ALLERGIES:  has no known allergies.  MEDICATIONS:  Current Outpatient Medications  Medication Sig Dispense Refill   dicyclomine  (BENTYL ) 10 MG capsule Take 1 capsule (10 mg total) by mouth 3 (three) times daily as needed for spasms (abdominal pain). 30 capsule 0   fexofenadine  (ALLEGRA ) 180 MG tablet Take 1 tablet (180 mg total) by mouth daily. 90 tablet 1   fluticasone (FLONASE) 50 MCG/ACT nasal spray Place 2 sprays into both nostrils daily.     levothyroxine  (SYNTHROID ) 50 MCG tablet Take 1 tablet (50 mcg total) by mouth daily. 30 tablet 11   ondansetron  (ZOFRAN -ODT) 4 MG disintegrating tablet Allow 1-2 tablets to dissolve in your mouth every 8 hours as needed for nausea/vomiting 30 tablet 0   pantoprazole  (PROTONIX ) 40 MG tablet Take 1 tablet (40 mg total) by mouth daily. 30 tablet 1   polyethylene glycol powder (GLYCOLAX /MIRALAX ) 17 GM/SCOOP powder Take 17 g by mouth  daily.     sucralfate  (CARAFATE ) 1 g tablet Take 1 tablet (1 g total) by mouth 4 (four) times daily for 15 days. 60 tablet 0   VENTOLIN  HFA 108 (90 Base) MCG/ACT inhaler INHALE 1 TO 2 PUFFS INTO THE LUNGS EVERY 4 HOURS AS NEEDED FOR WHEEZING OR SHORTNESS OF BREATH 18 g 5   No current facility-administered medications for this visit.   PHYSICAL EXAMINATION:  Vitals:   02/10/24 1409  BP: 115/68  Pulse: 88  Resp: 16  Temp: 98 F (36.7 C)  SpO2: 100%   Filed Weights   02/10/24 1409  Weight: 249 lb 8 oz (113.2 kg)    Physical Exam Vitals and nursing note reviewed.  HENT:     Head: Normocephalic and atraumatic.     Mouth/Throat:     Pharynx: Oropharynx is clear.  Eyes:     Extraocular Movements: Extraocular movements intact.     Pupils: Pupils are equal, round, and reactive to light.  Cardiovascular:     Rate and Rhythm: Normal rate and regular rhythm.  Pulmonary:  Comments: Decreased breath sounds bilaterally.  Abdominal:     Palpations: Abdomen is soft.  Musculoskeletal:        General: Normal range of motion.     Cervical back: Normal range of motion.  Skin:    General: Skin is warm.  Neurological:     General: No focal deficit present.     Mental Status: She is alert and oriented to person, place, and time.  Psychiatric:        Behavior: Behavior normal.        Judgment: Judgment normal.     LABORATORY DATA:  I have reviewed the data as listed Lab Results  Component Value Date   WBC 5.0 02/10/2024   HGB 11.8 (L) 02/10/2024   HCT 37.4 02/10/2024   MCV 86.2 02/10/2024   PLT 326 02/10/2024   Recent Labs    12/11/23 1419 01/10/24 2152 02/10/24 1500  NA 136 139 134*  K 3.9 3.4* 4.0  CL 106 105 105  CO2 22 25 22   GLUCOSE 104* 73 103*  BUN 12 12 11   CREATININE 1.00 0.84 0.83  CALCIUM 9.2 9.1 9.2  GFRNONAA >60 >60 >60  PROT 7.2 7.9 7.1  ALBUMIN 3.8 4.0 3.6  AST 23 20 22   ALT 13 15 22   ALKPHOS 57 68 52  BILITOT 1.2 0.9 0.5    RADIOGRAPHIC  STUDIES: I have personally reviewed the radiological images as listed and agreed with the findings in the report. No results found.   Menorrhagia with irregular cycle # Symptomatic Iron deficiency anemia: on PO iron [Iron sulfate- on Miralax -]. Check iron studies; ferritin.   # Hx of heavy menstrual cycles/hx suggestive of gum bleeding/Excessive bleeding after tooth extraction-HOWEVER, Elevated VWB antigen levels- re-check PT/PT and vWD work up.    Thank you for allowing me to participate in the care of your pleasant patient. Please do not hesitate to contact me with questions or concerns in the interim. The above of plan of care was discussed with Patient and her mother in detail.   # DISPOSITION: # labs today-  # follow up next Friday- MD; No labs- [if needed- ok with chemo pt slot]- Dr.B    Above plan of care was discussed with patient/family in detail.  My contact information was given to the patient/family.       Cindy JONELLE Joe, MD 02/15/2024 9:45 PM

## 2024-02-12 LAB — COAG STUDIES INTERP REPORT

## 2024-02-12 LAB — VON WILLEBRAND PANEL
Coagulation Factor VIII: 128 % (ref 56–140)
Ristocetin Co-factor, Plasma: 102 % (ref 50–200)
Von Willebrand Antigen, Plasma: 123 % (ref 50–200)

## 2024-02-19 ENCOUNTER — Encounter: Payer: Self-pay | Admitting: Internal Medicine

## 2024-02-19 ENCOUNTER — Inpatient Hospital Stay: Payer: MEDICAID | Admitting: Internal Medicine

## 2024-02-19 ENCOUNTER — Telehealth: Payer: Self-pay | Admitting: Internal Medicine

## 2024-02-19 NOTE — Telephone Encounter (Signed)
 She missed her appointment today, called to reschedule to 10/3, but wants someone to call and tell her her lab results.

## 2024-02-19 NOTE — Telephone Encounter (Signed)
 Patient declined follow-up in the clinic.   Spoke to patient regarding the results of the blood work-no evidence of bleeding diathesis [PT PTT normal; von Willebrand screening normal].  Mild anemia likely from her menstrual cycles.  Recommend follow-up with PCP/gynecology.  Follow-up with us  as needed  GB

## 2024-02-19 NOTE — Telephone Encounter (Signed)
 Pt rescheduled appointment for today that was for review of test results.  Patient has requested results over the phone. Please advise.

## 2024-02-19 NOTE — Telephone Encounter (Signed)
 Telephone call to patient by Dr Rennie.

## 2024-02-24 ENCOUNTER — Telehealth: Payer: Self-pay | Admitting: Internal Medicine

## 2024-02-24 NOTE — Telephone Encounter (Signed)
 Called patient to cancel appointment for 10/3 per Dr. KATHEE- not able to leave voicemail mailbox full. Appointment cancelled [employer] sent

## 2024-02-25 ENCOUNTER — Other Ambulatory Visit: Payer: Self-pay | Admitting: Family

## 2024-03-04 ENCOUNTER — Ambulatory Visit: Payer: MEDICAID | Admitting: Internal Medicine

## 2024-03-28 ENCOUNTER — Ambulatory Visit: Payer: MEDICAID | Admitting: "Endocrinology

## 2024-05-19 ENCOUNTER — Emergency Department
Admission: EM | Admit: 2024-05-19 | Discharge: 2024-05-19 | Disposition: A | Discharge status: Home / Self Care | Payer: MEDICAID | Attending: Emergency Medicine | Admitting: Emergency Medicine

## 2024-05-19 ENCOUNTER — Other Ambulatory Visit: Payer: Self-pay

## 2024-05-19 DIAGNOSIS — E039 Hypothyroidism, unspecified: Secondary | ICD-10-CM | POA: Insufficient documentation

## 2024-05-19 DIAGNOSIS — M542 Cervicalgia: Secondary | ICD-10-CM | POA: Insufficient documentation

## 2024-05-19 DIAGNOSIS — R5383 Other fatigue: Secondary | ICD-10-CM | POA: Insufficient documentation

## 2024-05-19 LAB — CBC
HCT: 39.7 % (ref 36.0–46.0)
Hemoglobin: 12.9 g/dL (ref 12.0–15.0)
MCH: 28 pg (ref 26.0–34.0)
MCHC: 32.5 g/dL (ref 30.0–36.0)
MCV: 86.3 fL (ref 80.0–100.0)
Platelets: 305 K/uL (ref 150–400)
RBC: 4.6 MIL/uL (ref 3.87–5.11)
RDW: 12.8 % (ref 11.5–15.5)
WBC: 6.7 K/uL (ref 4.0–10.5)
nRBC: 0 % (ref 0.0–0.2)

## 2024-05-19 LAB — BASIC METABOLIC PANEL WITH GFR
Anion gap: 12 (ref 5–15)
BUN: 13 mg/dL (ref 6–20)
CO2: 25 mmol/L (ref 22–32)
Calcium: 9.1 mg/dL (ref 8.9–10.3)
Chloride: 100 mmol/L (ref 98–111)
Creatinine, Ser: 0.88 mg/dL (ref 0.44–1.00)
GFR, Estimated: >60 mL/min (ref >60)
Glucose, Bld: 95 mg/dL (ref 70–99)
Potassium: 3.9 mmol/L (ref 3.5–5.1)
Sodium: 136 mmol/L (ref 135–145)

## 2024-05-19 LAB — TSH: TSH: 1.21 u[IU]/mL (ref 0.350–4.500)

## 2024-05-19 LAB — T4, FREE: Free T4: 1.11 ng/dL (ref 0.80–2.00)

## 2024-05-19 NOTE — ED Triage Notes (Signed)
 Pt here with possible thyroid  symptoms. Pt states she was told to stop talking her levothyroxine  by her primary to see if she was still symptomatic and she is now having them again: dry skin, constipation, weight gain, hair loss, and fatigue. Pt also c/o soreness on the outside of her neck.

## 2024-05-19 NOTE — ED Provider Notes (Signed)
 [facility] Emergency Department Provider Note     Event Date/Time   First MD Initiated Contact with Patient 05/19/24 1732     (approximate)   History   Fatigue   HPI  Tamara Sanders is a 18 y.o. female presents to the ED with complaint of fatigue ongoing for 2 months now.  Patient reports she has a history of hypothyroidism and discontinued her levothyroxine  5 months ago.  She complains in the past 2 months feeling fatigued, having constipation, hair loss, dry skin and weight gain.  Patient also complains of pain and soreness to the anterior aspect of her neck.  No other complaint at this time.     Physical Exam   Triage Vital Signs: ED Triage Vitals  Encounter Vitals Group     BP 05/19/24 1636 123/71     Girls Systolic BP Percentile --      Girls Diastolic BP Percentile --      Boys Systolic BP Percentile --      Boys Diastolic BP Percentile --      Pulse Rate 05/19/24 1636 90     Resp 05/19/24 1636 18     Temp 05/19/24 1636 98.4 F (36.9 C)     Temp Source 05/19/24 1636 Oral     SpO2 05/19/24 1636 99 %     Weight 05/19/24 1637 250 lb (113.4 kg)     Height 05/19/24 1637 5' 3 (1.6 m)     Head Circumference --      Peak Flow --      Pain Score 05/19/24 1636 7     Pain Loc --      Pain Education --      Exclude from Growth Chart --     Most recent vital signs: Vitals:   05/19/24 1636 05/19/24 1736  BP: 123/71   Pulse: 90   Resp: 18   Temp: 98.4 F (36.9 C)   SpO2: 99% 99%    General: Well appearing and comfortable. Alert and oriented. INAD.  Skin:  Warm, dry and intact. No rashes or lesions noted.     Head:  NCAT.  Eyes:  PERRLA. EOMI.  Neck:   Thyroid  is not palpable.  No enlarged thyroid .  Nontender to palpation over thyroid . CV:  Good peripheral perfusion. RRR. No peripheral edema.  RESP:  Normal effort. LCTAB. No retractions.   ED Results / Procedures / Treatments   Labs (all labs ordered are listed, but only  abnormal results are displayed) Labs Reviewed  CBC  BASIC METABOLIC PANEL WITH GFR  TSH  T4, FREE  T3   No results found.  PROCEDURES:  Critical Care performed: No  Procedures   MEDICATIONS ORDERED IN ED: Medications - No data to display   IMPRESSION / MDM / ASSESSMENT AND PLAN / ED COURSE  I reviewed the triage vital signs and the nursing notes.                               19 y.o. female presents to the emergency department for evaluation and treatment of fatigue. See HPI for further details.   Differential diagnosis includes, but is not limited to hypothyroidism, anemia, electrolyte abnormality  Patient's presentation is most consistent with acute complicated illness / injury requiring diagnostic workup.  Patient is alert and oriented.  She is hemodynamic stable.  Physical exam findings are stated above and overall benign.  Lab work is reassuring.  TSH is normal.  T4 is normal.  I discussed with the patient to follow-up with her primary care or endocrinologist for further evaluation.  She is requesting for prescription of levothyroxine .  I discussed with the patient that there is no indication that her fatigue is coming from an abnormality of this hormone and she would need to follow-up with a specialist for possible repeat studies.  Patient is stable condition for discharge home and outpatient management.  ED return precautions were discussed.  FINAL CLINICAL IMPRESSION(S) / ED DIAGNOSES   Final diagnoses:  Fatigue, unspecified type   Rx / DC Orders   ED Discharge Orders     None      Note:  This document was prepared using Dragon voice recognition software and may include unintentional dictation errors.    Margrette, Lilienne Weins A, PA-C 05/19/24 2346

## 2024-05-19 NOTE — Discharge Instructions (Signed)
 Your evaluated in the ED for fatigue.  Your lab work is reassuring.  Your TSH is normal.  Your T3 and T4 are pending.  Please review your MyChart for these results.  Follow-up with your primary care provider for further evaluation.  If any new or worsening symptoms occur such as chest pain, shortness of breath, trouble swallowing or difficulty breathing return to ED for further evaluation.

## 2024-07-13 ENCOUNTER — Ambulatory Visit: Payer: MEDICAID | Admitting: "Endocrinology
